# Patient Record
Sex: Female | Born: 2000 | Race: White | Hispanic: No | Marital: Single | Smoking: Never smoker
Health system: Southern US, Community
[De-identification: ages and names within clinical notes are randomized; demographics above are authoritative.]

---
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
## 2019-01-03 ENCOUNTER — Ambulatory Visit: Payer: Medicaid Other | Admitting: Registered Nurse

## 2019-01-03 ENCOUNTER — Other Ambulatory Visit: Payer: Self-pay

## 2019-07-19 DIAGNOSIS — Z419 Encounter for procedure for purposes other than remedying health state, unspecified: Secondary | ICD-10-CM | POA: Diagnosis not present

## 2019-08-19 DIAGNOSIS — Z419 Encounter for procedure for purposes other than remedying health state, unspecified: Secondary | ICD-10-CM | POA: Diagnosis not present

## 2019-09-19 DIAGNOSIS — Z419 Encounter for procedure for purposes other than remedying health state, unspecified: Secondary | ICD-10-CM | POA: Diagnosis not present

## 2019-10-19 DIAGNOSIS — Z419 Encounter for procedure for purposes other than remedying health state, unspecified: Secondary | ICD-10-CM | POA: Diagnosis not present

## 2019-11-19 DIAGNOSIS — Z419 Encounter for procedure for purposes other than remedying health state, unspecified: Secondary | ICD-10-CM | POA: Diagnosis not present

## 2019-12-19 DIAGNOSIS — Z419 Encounter for procedure for purposes other than remedying health state, unspecified: Secondary | ICD-10-CM | POA: Diagnosis not present

## 2020-01-19 DIAGNOSIS — Z419 Encounter for procedure for purposes other than remedying health state, unspecified: Secondary | ICD-10-CM | POA: Diagnosis not present

## 2020-02-19 DIAGNOSIS — Z419 Encounter for procedure for purposes other than remedying health state, unspecified: Secondary | ICD-10-CM | POA: Diagnosis not present

## 2020-03-18 DIAGNOSIS — Z419 Encounter for procedure for purposes other than remedying health state, unspecified: Secondary | ICD-10-CM | POA: Diagnosis not present

## 2020-04-18 DIAGNOSIS — Z419 Encounter for procedure for purposes other than remedying health state, unspecified: Secondary | ICD-10-CM | POA: Diagnosis not present

## 2020-05-18 DIAGNOSIS — Z419 Encounter for procedure for purposes other than remedying health state, unspecified: Secondary | ICD-10-CM | POA: Diagnosis not present

## 2020-06-18 DIAGNOSIS — Z419 Encounter for procedure for purposes other than remedying health state, unspecified: Secondary | ICD-10-CM | POA: Diagnosis not present

## 2020-07-18 DIAGNOSIS — Z419 Encounter for procedure for purposes other than remedying health state, unspecified: Secondary | ICD-10-CM | POA: Diagnosis not present

## 2020-08-18 DIAGNOSIS — Z419 Encounter for procedure for purposes other than remedying health state, unspecified: Secondary | ICD-10-CM | POA: Diagnosis not present

## 2020-09-18 DIAGNOSIS — Z419 Encounter for procedure for purposes other than remedying health state, unspecified: Secondary | ICD-10-CM | POA: Diagnosis not present

## 2020-10-18 DIAGNOSIS — Z419 Encounter for procedure for purposes other than remedying health state, unspecified: Secondary | ICD-10-CM | POA: Diagnosis not present

## 2020-11-18 DIAGNOSIS — Z419 Encounter for procedure for purposes other than remedying health state, unspecified: Secondary | ICD-10-CM | POA: Diagnosis not present

## 2020-12-18 DIAGNOSIS — Z419 Encounter for procedure for purposes other than remedying health state, unspecified: Secondary | ICD-10-CM | POA: Diagnosis not present

## 2021-01-18 DIAGNOSIS — Z419 Encounter for procedure for purposes other than remedying health state, unspecified: Secondary | ICD-10-CM | POA: Diagnosis not present

## 2021-02-18 DIAGNOSIS — Z419 Encounter for procedure for purposes other than remedying health state, unspecified: Secondary | ICD-10-CM | POA: Diagnosis not present

## 2021-03-18 DIAGNOSIS — Z419 Encounter for procedure for purposes other than remedying health state, unspecified: Secondary | ICD-10-CM | POA: Diagnosis not present

## 2021-04-18 DIAGNOSIS — Z419 Encounter for procedure for purposes other than remedying health state, unspecified: Secondary | ICD-10-CM | POA: Diagnosis not present

## 2021-05-18 DIAGNOSIS — Z419 Encounter for procedure for purposes other than remedying health state, unspecified: Secondary | ICD-10-CM | POA: Diagnosis not present

## 2021-06-18 DIAGNOSIS — Z419 Encounter for procedure for purposes other than remedying health state, unspecified: Secondary | ICD-10-CM | POA: Diagnosis not present

## 2021-07-18 DIAGNOSIS — Z419 Encounter for procedure for purposes other than remedying health state, unspecified: Secondary | ICD-10-CM | POA: Diagnosis not present

## 2021-08-18 DIAGNOSIS — Z419 Encounter for procedure for purposes other than remedying health state, unspecified: Secondary | ICD-10-CM | POA: Diagnosis not present

## 2021-09-18 DIAGNOSIS — Z419 Encounter for procedure for purposes other than remedying health state, unspecified: Secondary | ICD-10-CM | POA: Diagnosis not present

## 2021-10-18 DIAGNOSIS — Z419 Encounter for procedure for purposes other than remedying health state, unspecified: Secondary | ICD-10-CM | POA: Diagnosis not present

## 2021-11-18 DIAGNOSIS — Z419 Encounter for procedure for purposes other than remedying health state, unspecified: Secondary | ICD-10-CM | POA: Diagnosis not present

## 2021-12-18 DIAGNOSIS — Z419 Encounter for procedure for purposes other than remedying health state, unspecified: Secondary | ICD-10-CM | POA: Diagnosis not present

## 2022-01-18 DIAGNOSIS — Z419 Encounter for procedure for purposes other than remedying health state, unspecified: Secondary | ICD-10-CM | POA: Diagnosis not present

## 2022-02-18 DIAGNOSIS — Z419 Encounter for procedure for purposes other than remedying health state, unspecified: Secondary | ICD-10-CM | POA: Diagnosis not present

## 2022-03-19 DIAGNOSIS — Z419 Encounter for procedure for purposes other than remedying health state, unspecified: Secondary | ICD-10-CM | POA: Diagnosis not present

## 2022-04-19 DIAGNOSIS — Z419 Encounter for procedure for purposes other than remedying health state, unspecified: Secondary | ICD-10-CM | POA: Diagnosis not present

## 2022-05-19 DIAGNOSIS — Z419 Encounter for procedure for purposes other than remedying health state, unspecified: Secondary | ICD-10-CM | POA: Diagnosis not present

## 2022-06-19 DIAGNOSIS — Z419 Encounter for procedure for purposes other than remedying health state, unspecified: Secondary | ICD-10-CM | POA: Diagnosis not present

## 2022-07-19 DIAGNOSIS — Z419 Encounter for procedure for purposes other than remedying health state, unspecified: Secondary | ICD-10-CM | POA: Diagnosis not present

## 2022-08-19 DIAGNOSIS — Z419 Encounter for procedure for purposes other than remedying health state, unspecified: Secondary | ICD-10-CM | POA: Diagnosis not present

## 2022-09-19 DIAGNOSIS — Z419 Encounter for procedure for purposes other than remedying health state, unspecified: Secondary | ICD-10-CM | POA: Diagnosis not present

## 2022-10-19 DIAGNOSIS — Z419 Encounter for procedure for purposes other than remedying health state, unspecified: Secondary | ICD-10-CM | POA: Diagnosis not present

## 2022-11-19 DIAGNOSIS — Z419 Encounter for procedure for purposes other than remedying health state, unspecified: Secondary | ICD-10-CM | POA: Diagnosis not present

## 2022-12-19 DIAGNOSIS — Z419 Encounter for procedure for purposes other than remedying health state, unspecified: Secondary | ICD-10-CM | POA: Diagnosis not present

## 2023-01-19 DIAGNOSIS — Z419 Encounter for procedure for purposes other than remedying health state, unspecified: Secondary | ICD-10-CM | POA: Diagnosis not present

## 2023-02-19 DIAGNOSIS — Z419 Encounter for procedure for purposes other than remedying health state, unspecified: Secondary | ICD-10-CM | POA: Diagnosis not present

## 2023-03-19 DIAGNOSIS — Z419 Encounter for procedure for purposes other than remedying health state, unspecified: Secondary | ICD-10-CM | POA: Diagnosis not present
